# Patient Record
Sex: Female | Born: 1955 | Race: Black or African American | Hispanic: No | Marital: Married | Smoking: Never smoker
Health system: Southern US, Community
[De-identification: ages and names within clinical notes are randomized; demographics above are authoritative.]

## PROBLEM LIST (undated history)

## (undated) DIAGNOSIS — Z789 Other specified health status: Secondary | ICD-10-CM

## (undated) HISTORY — PX: BREAST EXCISIONAL BIOPSY: SUR124

---

## 2002-08-01 ENCOUNTER — Encounter: Admission: RE | Admit: 2002-08-01 | Discharge: 2002-08-01 | Payer: Self-pay | Admitting: Family Medicine

## 2002-08-01 ENCOUNTER — Encounter: Payer: Self-pay | Admitting: Family Medicine

## 2004-07-05 ENCOUNTER — Encounter: Admission: RE | Admit: 2004-07-05 | Discharge: 2004-07-05 | Payer: Self-pay | Admitting: Specialist

## 2004-11-04 ENCOUNTER — Other Ambulatory Visit: Admission: RE | Admit: 2004-11-04 | Discharge: 2004-11-04 | Payer: Self-pay | Admitting: Obstetrics and Gynecology

## 2006-07-22 ENCOUNTER — Ambulatory Visit (HOSPITAL_COMMUNITY): Admission: RE | Admit: 2006-07-22 | Discharge: 2006-07-22 | Payer: Self-pay | Admitting: Obstetrics and Gynecology

## 2006-07-22 ENCOUNTER — Encounter (INDEPENDENT_AMBULATORY_CARE_PROVIDER_SITE_OTHER): Payer: Self-pay | Admitting: *Deleted

## 2009-01-26 ENCOUNTER — Emergency Department (HOSPITAL_COMMUNITY): Admission: EM | Admit: 2009-01-26 | Discharge: 2009-01-26 | Payer: Self-pay | Admitting: Emergency Medicine

## 2009-07-20 ENCOUNTER — Other Ambulatory Visit: Admission: RE | Admit: 2009-07-20 | Discharge: 2009-07-20 | Payer: Self-pay | Admitting: Family Medicine

## 2009-07-30 ENCOUNTER — Encounter: Admission: RE | Admit: 2009-07-30 | Discharge: 2009-07-30 | Payer: Self-pay | Admitting: Family Medicine

## 2009-08-13 ENCOUNTER — Encounter: Admission: RE | Admit: 2009-08-13 | Discharge: 2009-08-13 | Payer: Self-pay | Admitting: Family Medicine

## 2009-08-30 ENCOUNTER — Ambulatory Visit: Payer: Self-pay | Admitting: Gastroenterology

## 2009-09-14 ENCOUNTER — Ambulatory Visit (HOSPITAL_COMMUNITY): Admission: RE | Admit: 2009-09-14 | Discharge: 2009-09-14 | Payer: Self-pay | Admitting: Gastroenterology

## 2009-09-27 ENCOUNTER — Ambulatory Visit: Payer: Self-pay | Admitting: Gastroenterology

## 2010-09-10 ENCOUNTER — Other Ambulatory Visit: Payer: Self-pay | Admitting: Family Medicine

## 2010-09-10 DIAGNOSIS — Z1239 Encounter for other screening for malignant neoplasm of breast: Secondary | ICD-10-CM

## 2010-10-15 ENCOUNTER — Ambulatory Visit
Admission: RE | Admit: 2010-10-15 | Discharge: 2010-10-15 | Disposition: A | Discharge status: Home / Self Care | Payer: Commercial Indemnity | Source: Ambulatory Visit | Attending: Family Medicine | Admitting: Family Medicine

## 2010-10-15 DIAGNOSIS — Z1239 Encounter for other screening for malignant neoplasm of breast: Secondary | ICD-10-CM

## 2010-12-27 NOTE — Op Note (Signed)
Rhonda Branch, Rhonda Branch             ACCOUNT NO.:  [id number]   MEDICAL RECORD NO.:  [id number]          PATIENT TYPE:  AMB   LOCATION:  SDC                           FACILITY:  [facility]   PHYSICIAN:  Juluis Mire, M.D.   DATE OF BIRTH:  03/11/1956   DATE OF PROCEDURE:  07/22/2006  DATE OF DISCHARGE:                               OPERATIVE REPORT   ADMISSION DIAGNOSIS:  Abnormal uterine bleeding with apparent  endometrial polyp.   POSTOPERATIVE DIAGNOSIS:  Abnormal uterine bleeding with apparent  endometrial polyp.   PROCEDURE:  Cervical dilatation, hysteroscopy, resection of polyp.  Multiple endometrial biopsies.  Endometrial curettings.   SURGEON:  Juluis Mire, M.D.   ANESTHESIA:  Was general.   ESTIMATED BLOOD LOSS:  Minimal.   PACKS AND DRAINS:  None.   INTRAOPERATIVE BLOOD REPLACED:  None.   COMPLICATIONS:  None.   INDICATIONS:  Dictated in history and physical.   PROCEDURE AS FOLLOWS:  The patient was taken to OR and placed in supine  position.  After satisfactory level of general endotracheal anesthesia  obtained, the patient was placed in a dorsal position using the [employer]  stirrups.  Perineum and vagina were prepped with Betadine and draped in  a sterile field.  Speculum was placed in the vaginal vault.  Cervix was  grasped with a single-tooth tenaculum.  Paracervical block was  administered using 1% Nesacaine.  Uterus sounded to 8 cm.  Cervix was  serially dilated to a size 33 Pratt dilator.  Operative hysteroscope was  introduced into the intrauterine cavity which was distended using  sorbitol.  Visualization revealed a posterior wall polyp.  This was  resected and sent for pathology.  Multiple endometrial biopsies were  obtained and sent to pathology.  Endometrial curettings were obtained.  Total deficit was minimal.  There was no signs of perforation or active  bleeding.  The single-tooth tenaculum and speculum were then removed.  The patient taken out dorsal  position.  Once alert and extubated,  transferred to the recovery room in good condition.  Sponge, instrument  and needle count was reported as correct by circulating nurse x2.     Juluis Mire, M.D.  Electronically Signed    JSM/MEDQ  D:  07/22/2006  T:  07/22/2006  Job:  [id number]

## 2010-12-27 NOTE — H&P (Signed)
Rhonda Branch, Rhonda Branch NO.:  [id number]   MEDICAL RECORD NO.:  [id number]          PATIENT TYPE:  AMB   LOCATION:  SDC                           FACILITY:  [facility]   PHYSICIAN:  Juluis Mire, M.D.   DATE OF BIRTH:  1955/10/06   DATE OF ADMISSION:  07/22/2006  DATE OF DISCHARGE:                              HISTORY & PHYSICAL   The patient is a 55 year old gravida 3, para 2, abortus 1 female who  presents for hysteroscopy.   Patient has had a history of menstrual irregularities in May of this  year.  We did a saline infusion ultrasound that revealed fibroids.  She  had a 2-cm right ovarian cyst and a large polypoid mass with thin  endometrial cavity.  She failed to arrange hysteroscopy, but she is  admitted at the present time to undergo hysteroscopy for evaluation.   In terms of allergies, no known drug allergies.   MEDICATIONS:  None.   PAST MEDICAL HISTORY:  Usual childhood diseases.  No significant  sequelae.  She has had two prior cesarean sections.  No other surgical  history.   FAMILY HISTORY:  History of hypertension.   SOCIAL HISTORY:  No tobacco or alcohol use.   REVIEW OF SYSTEMS:  Is noncontributory.   PHYSICAL EXAMINATION:  The patient is afebrile with stable vital signs.  HEENT EXAM:  Patient is normocephalic.  Pupils equal and reactive to  light accommodation.  Extraocular were intact.  Sclerae and conjunctivae  were clear.  Oropharynx clear.  NECK:  Without thyromegaly.  BREASTS:  No discrete masses.  LUNGS:  Clear.  CARDIAC SYSTEM:  Regular rhythm and rate without murmurs or gallops.  ABDOMINAL EXAM:  Is benign.  No masses, organomegaly or tenderness.  PELVIC:  Normal external genitalia.  Vaginal mucosa is clear.  Cervix is  unremarkable.  Uterus upper limits of normal size, irregular consistent  with known fibroids.  Adnexa unremarkable.  EXTREMITIES:  Trace edema.  NEUROLOGICAL EXAM:  Is grossly within normal limits.   IMPRESSION:   Abnormal uterine bleeding with endometrial polyp.   PLAN:  The patient will undergo hysteroscopy with resection of polypoid  area.  The risks have been discussed including the risk of infection.  Risk of hemorrhage that could require transfusion with the risk of AIDS  or hepatitis.  The risk of injury to adjacent organs including bladder  or bowel or ureters.  Risk of deep venous thrombosis and pulmonary  embolus.  The patient expressed understanding of indications and risks.      Juluis Mire, M.D.  Electronically Signed     JSM/MEDQ  D:  07/22/2006  T:  07/22/2006  Job:  [id number]

## 2011-07-16 ENCOUNTER — Ambulatory Visit: Payer: Self-pay | Admitting: Physician Assistant

## 2011-09-10 ENCOUNTER — Other Ambulatory Visit: Payer: Self-pay | Admitting: Family Medicine

## 2011-09-10 DIAGNOSIS — M899 Disorder of bone, unspecified: Secondary | ICD-10-CM

## 2011-09-10 DIAGNOSIS — Z1231 Encounter for screening mammogram for malignant neoplasm of breast: Secondary | ICD-10-CM

## 2011-10-20 ENCOUNTER — Other Ambulatory Visit: Payer: Commercial Indemnity

## 2011-10-20 ENCOUNTER — Ambulatory Visit: Payer: Commercial Indemnity

## 2011-11-03 ENCOUNTER — Ambulatory Visit
Admission: RE | Admit: 2011-11-03 | Discharge: 2011-11-03 | Disposition: A | Discharge status: Home / Self Care | Payer: Commercial Indemnity | Source: Ambulatory Visit | Attending: Family Medicine | Admitting: Family Medicine

## 2011-11-03 DIAGNOSIS — M899 Disorder of bone, unspecified: Secondary | ICD-10-CM

## 2011-11-03 DIAGNOSIS — M949 Disorder of cartilage, unspecified: Secondary | ICD-10-CM

## 2011-11-03 DIAGNOSIS — Z1231 Encounter for screening mammogram for malignant neoplasm of breast: Secondary | ICD-10-CM

## 2012-09-15 ENCOUNTER — Other Ambulatory Visit: Payer: Self-pay | Admitting: Family Medicine

## 2012-09-15 DIAGNOSIS — Z1231 Encounter for screening mammogram for malignant neoplasm of breast: Secondary | ICD-10-CM

## 2012-11-03 ENCOUNTER — Ambulatory Visit
Admission: RE | Admit: 2012-11-03 | Discharge: 2012-11-03 | Disposition: A | Discharge status: Home / Self Care | Payer: Commercial Indemnity | Source: Ambulatory Visit | Attending: Family Medicine | Admitting: Family Medicine

## 2012-11-03 DIAGNOSIS — Z1231 Encounter for screening mammogram for malignant neoplasm of breast: Secondary | ICD-10-CM

## 2013-10-24 ENCOUNTER — Other Ambulatory Visit: Payer: Self-pay

## 2013-10-24 ENCOUNTER — Other Ambulatory Visit: Payer: Self-pay | Admitting: Family Medicine

## 2013-10-24 DIAGNOSIS — Z1231 Encounter for screening mammogram for malignant neoplasm of breast: Secondary | ICD-10-CM

## 2013-11-08 ENCOUNTER — Ambulatory Visit
Admission: RE | Admit: 2013-11-08 | Discharge: 2013-11-08 | Disposition: A | Discharge status: Home / Self Care | Payer: BC Managed Care – PPO | Source: Ambulatory Visit

## 2013-11-08 DIAGNOSIS — Z1231 Encounter for screening mammogram for malignant neoplasm of breast: Secondary | ICD-10-CM

## 2013-11-09 ENCOUNTER — Ambulatory Visit: Payer: Commercial Indemnity

## 2016-11-05 ENCOUNTER — Other Ambulatory Visit: Payer: Self-pay | Admitting: Family Medicine

## 2016-11-05 DIAGNOSIS — Z1231 Encounter for screening mammogram for malignant neoplasm of breast: Secondary | ICD-10-CM

## 2016-12-03 ENCOUNTER — Ambulatory Visit
Admission: RE | Admit: 2016-12-03 | Discharge: 2016-12-03 | Disposition: A | Discharge status: Home / Self Care | Payer: Managed Care, Other (non HMO) | Source: Ambulatory Visit | Attending: Family Medicine | Admitting: Family Medicine

## 2016-12-03 DIAGNOSIS — Z1231 Encounter for screening mammogram for malignant neoplasm of breast: Secondary | ICD-10-CM

## 2017-02-10 ENCOUNTER — Other Ambulatory Visit (HOSPITAL_COMMUNITY)
Admission: RE | Admit: 2017-02-10 | Discharge: 2017-02-10 | Disposition: A | Discharge status: Home / Self Care | Payer: Managed Care, Other (non HMO) | Source: Ambulatory Visit | Attending: Family Medicine | Admitting: Family Medicine

## 2017-02-10 ENCOUNTER — Other Ambulatory Visit: Payer: Self-pay | Admitting: Family Medicine

## 2017-02-10 DIAGNOSIS — Z124 Encounter for screening for malignant neoplasm of cervix: Secondary | ICD-10-CM | POA: Insufficient documentation

## 2017-02-16 LAB — CYTOLOGY - PAP
Diagnosis: NEGATIVE
HPV: NOT DETECTED

## 2017-11-05 ENCOUNTER — Other Ambulatory Visit: Payer: Self-pay | Admitting: Family Medicine

## 2017-11-05 DIAGNOSIS — Z1231 Encounter for screening mammogram for malignant neoplasm of breast: Secondary | ICD-10-CM

## 2017-12-04 ENCOUNTER — Ambulatory Visit: Payer: Managed Care, Other (non HMO)

## 2017-12-04 ENCOUNTER — Ambulatory Visit
Admission: RE | Admit: 2017-12-04 | Discharge: 2017-12-04 | Disposition: A | Discharge status: Home / Self Care | Payer: Managed Care, Other (non HMO) | Source: Ambulatory Visit | Attending: Family Medicine | Admitting: Family Medicine

## 2017-12-04 DIAGNOSIS — Z1231 Encounter for screening mammogram for malignant neoplasm of breast: Secondary | ICD-10-CM

## 2018-04-01 ENCOUNTER — Other Ambulatory Visit: Payer: Self-pay

## 2018-04-01 ENCOUNTER — Emergency Department (HOSPITAL_COMMUNITY): Payer: Self-pay

## 2018-04-01 ENCOUNTER — Emergency Department (HOSPITAL_COMMUNITY)
Admission: EM | Admit: 2018-04-01 | Discharge: 2018-04-01 | Disposition: A | Discharge status: Home / Self Care | Payer: Self-pay | Attending: Emergency Medicine | Admitting: Emergency Medicine

## 2018-04-01 ENCOUNTER — Encounter (HOSPITAL_COMMUNITY): Payer: Self-pay

## 2018-04-01 DIAGNOSIS — W19XXXA Unspecified fall, initial encounter: Secondary | ICD-10-CM

## 2018-04-01 DIAGNOSIS — M25551 Pain in right hip: Secondary | ICD-10-CM | POA: Insufficient documentation

## 2018-04-01 DIAGNOSIS — R52 Pain, unspecified: Secondary | ICD-10-CM

## 2018-04-01 DIAGNOSIS — M542 Cervicalgia: Secondary | ICD-10-CM | POA: Insufficient documentation

## 2018-04-01 DIAGNOSIS — R030 Elevated blood-pressure reading, without diagnosis of hypertension: Secondary | ICD-10-CM

## 2018-04-01 HISTORY — DX: Other specified health status: Z78.9

## 2018-04-01 MED ORDER — IBUPROFEN 400 MG PO TABS: 400.0000 mg | ORAL_TABLET | Freq: Four times a day (QID) | ORAL | 0 refills | Status: AC | PRN

## 2018-04-01 MED ORDER — LIDOCAINE 5 % EX PTCH: 1.0000 | MEDICATED_PATCH | CUTANEOUS | 0 refills | Status: AC

## 2018-04-01 NOTE — ED Triage Notes (Signed)
Pt states she fell in [employer] last week. States yesterday she began having right lateral neck pain. Pt alert and oriented, full ROM.

## 2018-04-01 NOTE — ED Provider Notes (Signed)
[facility] EMERGENCY DEPARTMENT Provider Note   CSN: [id number] Arrival date & time: 04/01/18  0906     History   Chief Complaint Chief Complaint  Patient presents with  . Fall    HPI Rhonda Branch is a 62 y.o. female presenting for right-sided neck pain and right hip pain after fall 7 days ago.  Patient states that last week she was walking around [employer] and slipped on a wet floor.  Patient states that she fell striking her right hip and caught herself on her right arm patient denies head injury, loss of consciousness or use of blood thinner.  Patient states that she was ambulatory medially after the incident, was not seen by a medical provider.  Patient describes her right-sided neck pain as a moderate 6/10 in severity throbbing pain that radiates up to her head, worse with turning head to the right.  Patient states that she has not taken anything for this pain.  Patient also endorses 2/10 in severity right hip pain that is also throbbing in nature worse with palpation of the area.  She states she has not taken anything for this pain either.  HPI  Past Medical History:  Diagnosis Date  . Medical history non-contributory     There are no active problems to display for this patient.   Past Surgical History:  Procedure Laterality Date  . BREAST EXCISIONAL BIOPSY Right    1985     OB History   None      Home Medications    Prior to Admission medications   Not on File    Family History Family History  Problem Relation Age of Onset  . Breast cancer Neg Hx     Social History Social History   Tobacco Use  . Smoking status: Never Smoker  . Smokeless tobacco: Never Used  Substance Use Topics  . Alcohol use: Not Currently  . Drug use: Never     Allergies   Patient has no known allergies.   Review of Systems Review of Systems  Constitutional: Negative.  Negative for chills, fatigue and fever.  Eyes: Negative.  Negative for visual  disturbance.  Gastrointestinal: Negative.  Negative for abdominal pain, diarrhea, nausea and vomiting.  Musculoskeletal: Positive for arthralgias and neck pain. Negative for back pain and joint swelling.  Skin: Negative.  Negative for wound.  Neurological: Negative.  Negative for dizziness, syncope, weakness, light-headedness, numbness and headaches.     Physical Exam Updated Vital Signs BP (!) 176/75 (BP Location: Right Arm)   Pulse 70   Temp 98.9 F (37.2 C) (Oral)   Resp 17   SpO2 99%   Physical Exam  Constitutional: She is oriented to person, place, and time. She appears well-developed and well-nourished. No distress.  HENT:  Head: Normocephalic and atraumatic.  Right Ear: External ear normal.  Left Ear: External ear normal.  Nose: Nose normal.  Eyes: Pupils are equal, round, and reactive to light. EOM are normal.  Neck: Trachea normal, normal range of motion and phonation normal. Neck supple. Muscular tenderness present. No tracheal tenderness and no spinous process tenderness present. No neck rigidity. No tracheal deviation and normal range of motion present.    No midline cervical spinal tenderness to palpation, no step-offs, no crepitus no deformity.  Patient with right trapezius muscle tenderness worse to palpation and turning head to right.  No obvious deformity or sign of injury.   Cardiovascular:  Pulses:  Dorsalis pedis pulses are 2+ on the right side, and 2+ on the left side.       Posterior tibial pulses are 2+ on the right side, and 2+ on the left side.  Pulmonary/Chest: Effort normal. No respiratory distress.  Abdominal: Soft. There is no tenderness. There is no rebound and no guarding.  Musculoskeletal: Normal range of motion. She exhibits no deformity.       Right shoulder: Normal. She exhibits normal range of motion, no tenderness, no bony tenderness, no swelling, no crepitus and no deformity.       Left shoulder: Normal.       Right elbow: Normal.       Left elbow: Normal.       Right wrist: Normal.       Left wrist: Normal.       Right hip: She exhibits tenderness. She exhibits normal range of motion, normal strength, no bony tenderness, no swelling, no crepitus and no deformity.       Cervical back: Normal.       Thoracic back: Normal.       Lumbar back: Normal.       Right upper arm: She exhibits tenderness. She exhibits no bony tenderness, no swelling, no edema and no deformity.       Right forearm: Normal.       Right hand: Normal. Normal sensation noted. Normal strength noted.  No midline spinal tenderness to palpation, no paraspinal muscle tenderness, no deformity, crepitus, or step-off noted  Patient with mild tenderness to palpation of the right hip.  No signs of injury, no bruising.  Patient with a small healing 3 cm diameter bruise to the posterior right upper arm.  This appears to be well-healing, no deformity, no increased warmth or erythema or signs of infection.  Patient able to lift arms above head and touch without pain.  Feet:  Right Foot:  Protective Sensation: 3 sites tested. 3 sites sensed.  Left Foot:  Protective Sensation: 3 sites tested. 3 sites sensed.  Neurological: She is alert and oriented to person, place, and time. She has normal strength. No cranial nerve deficit or sensory deficit. She displays a negative Romberg sign.  Mental Status: Alert, oriented, thought content appropriate, able to give a coherent history. Speech fluent without evidence of aphasia. Able to follow 2 step commands without difficulty. Cranial Nerves: II: Peripheral visual fields grossly normal, pupils equal, round, reactive to light III,IV, VI: ptosis not present, extra-ocular motions intact bilaterally V,VII: smile symmetric, eyebrows raise symmetric, facial light touch sensation equal VIII: hearing grossly normal to voice X: uvula elevates symmetrically XI: bilateral shoulder shrug symmetric and strong XII: midline tongue  extension without fassiculations Motor: Normal tone. 5/5 strength in upper and lower extremities bilaterally including strong and equal grip strength and dorsiflexion/plantar flexion Sensory: Sensation intact to light touch in all extremities.Negative Romberg.  Cerebellar: normal finger-to-nose with bilateral upper extremities. Normal heel-to -shin balance bilaterally of the lower extremity. No pronator drift.  Gait: normal gait and balance CV: distal pulses palpable throughout   Skin: Skin is warm and dry.  Psychiatric: She has a normal mood and affect. Her behavior is normal.     ED Treatments / Results  Labs (all labs ordered are listed, but only abnormal results are displayed) Labs Reviewed - No data to display  EKG None  Radiology Dg Cervical Spine Complete  Result Date: 04/01/2018 CLINICAL DATA:  Right lateral neck pain.  Fall. EXAM: CERVICAL SPINE -  COMPLETE 4+ VIEW COMPARISON:  None. FINDINGS: There is no evidence of cervical spine fracture or prevertebral soft tissue swelling. Alignment is normal. No other significant bone abnormalities are identified. IMPRESSION: Negative cervical spine radiographs. Electronically Signed   By: Charlett Nose M.D.   On: 04/01/2018 10:41   Dg Pelvis 1-2 Views  Result Date: 04/01/2018 CLINICAL DATA:  62 year old female status post fall 6 days ago with right hip pain. EXAM: PELVIS - 1-2 VIEW COMPARISON:  None. FINDINGS: Bone mineralization is within normal limits for age. Femoral heads are normally located. Hip joint spaces appear symmetric and normal. Intact pelvis. Sacral ala and SI joints appear normal. Both proximal femurs appear grossly intact. Negative visible bowel gas pattern in lower abdominal/pelvis visceral contours. IMPRESSION: No acute fracture or dislocation identified about the pelvis. If there is lateralizing right hip pain, dedicated right hip series is recommended. Electronically Signed   By: Odessa Fleming M.D.   On: 04/01/2018 10:41    Dg Hip Unilat W Or Wo Pelvis 2-3 Views Right  Result Date: 04/01/2018 CLINICAL DATA:  Right hip pain EXAM: DG HIP (WITH OR WITHOUT PELVIS) 2-3V RIGHT COMPARISON:  None. FINDINGS: Mild degenerative changes in the right hip with early joint space narrowing and mild spurring. No acute bony abnormality. Specifically, no fracture, subluxation, or dislocation. IMPRESSION: Mild degenerative changes.  No acute bony abnormality. Electronically Signed   By: Charlett Nose M.D.   On: 04/01/2018 11:28    Procedures Procedures (including critical care time)  Medications Ordered in ED Medications - No data to display   Initial Impression / Assessment and Plan / ED Course  I have reviewed the triage vital signs and the nursing notes.  Pertinent labs & imaging results that were available during my care of the patient were reviewed by me and considered in my medical decision making (see chart for details).    Patient presenting after fall 7 days ago.  Patient with right-sided neck pain and right hip pain.  No neuro deficits on exam.  Imaging today negative for acute fractures.  It is likely that the patient is experiencing musculoskeletal soreness after her fall.  Patient denies history of kidney disease or gastric bleeding.  Patient prescribed anti-inflammatories for her pain.  Patient also given Lidoderm patch for pain.  Muscle relaxers not prescribed due to patient's age.  Patient encouraged to follow-up with her primary care provider regarding her visit today.  The patient was noted to have elevated BP in ED today. No known diagnosis of HTN.  I instructed the patient to followup with their PCP within 1 week for BP check. I also counseled the patient regarding the signs and symptoms which would require an emergent visit to an emergency department for hypertensive urgency and/or hypertensive emergency. Patient states that she does have a primary care provider that she can reliably follow-up with.  At this  time there does not appear to be any evidence of an acute emergency medical condition and the patient appears stable for discharge with appropriate outpatient follow up. Diagnosis was discussed with patient who verbalizes understanding of care plan and is agreeable to discharge. I have discussed return precautions with patient and family at bedside who verbalize understanding of return precautions. Patient strongly encouraged to follow-up with their PCP. All questions answered.   Note: Portions of this report may have been transcribed using voice recognition software. Every effort was made to ensure accuracy; however, inadvertent computerized transcription errors may still be present.  Final Clinical Impressions(s) / ED Diagnoses   Final diagnoses:  Fall, initial encounter  Neck pain  Right hip pain    ED Discharge Orders    None       Elizabeth PalauMorelli, Cameran Ahmed A, PA-C 04/01/18 1447    Tegeler, Canary Brimhristopher J, MD 04/01/18 1534

## 2018-04-01 NOTE — Discharge Instructions (Signed)
Please return to the Emergency Department for any new or worsening symptoms or if your symptoms do not improve. Please be sure to follow up with your Primary Care Physician as soon as possible regarding your visit today. If you do not have a Primary Doctor please use the resources below to establish one. You may use the anti-inflammatory medication ibuprofen as prescribed for pain.  Please be sure to eat food and drink plenty water while taking this medication. You may use the Lidoderm patch as prescribed for pain over the area of soreness on your right neck/shoulder muscles. Your blood pressure was also elevated in the department today.  Please be sure to follow-up with your primary care provider for a blood pressure recheck and follow-up for this as well. Translation from [employer], there may contain errors. 1. Regrese al Departamento de Emergencias por cualquier sntoma nuevo o que empeore o si sus sntomas no mejoran. 2. Asegrese de hacer un seguimiento con su mdico de atencin primaria lo antes posible con respecto a su visita de hoy. Si no tiene un mdico primario, utilice los recursos a continuacin para Clinical research associate. 3. Puede usar el medicamento antiinflamatorio ibuprofeno segn lo prescrito para Chief Technology Officer. Asegrese de comer y beber mucha agua mientras toma este medicamento. 4. Puede usar el parche Lidoderm segn lo prescrito para el dolor sobre el rea de dolor en los msculos del cuello / hombro derecho.  SOLICITE ATENCIN MDICA SI: El Sport and exercise psychologist empeora y los medicamentos no surten [place]. Tiene dolor muscular que dura ms de 3 das. Tiene una erupcin cutnea o fiebre junto con el dolor muscular. Tiene dolor muscular despus de una picadura de garrapata. Tiene dolor muscular mientras hace actividad fsica, aunque est en buen estado fsico. Tiene enrojecimiento, sensibilidad o hinchazn junto con el dolor muscular. Tiene dolor muscular despus de comenzar un medicamento nuevo o de  cambiar la dosis de un medicamento. SOLICITE ATENCIN MDICA DE INMEDIATO SI: Tiene dificultad para respirar. Presenta dificultad para tragar. Tiene dolor muscular junto con rigidez en el cuello, fiebre y vmitos. Tiene debilidad muscular intensa o no puede mover una parte del cuerpo.  RESOURCE GUIDE  Chronic Pain Problems: Contact [facility]  [phone] Patients need to be referred by their primary care doctor.  Insufficient Money for Medicine: Contact United Way:  call "211" or [facility] [phone].  No Primary Care Doctor: Call [facility]  [phone] - can help you locate a primary care doctor that  accepts your insurance, provides certain services, etc. Physician Referral [phone]  Agencies that provide inexpensive medical care: [facility]  [phone] [facility]  [phone] [facility]  [phone] [facility]  [phone] [facility]  [phone] [facility]  [phone]  Medicaid-accepting [facility] Providers: [facility]- [address]  [phone], Mon-Fri 9am-7pm, Sat 9am-1pm [facility]- [address] [place], Suite [place]  [phone] [facility]- [address] [place]  [phone] [facility]- [address]  [phone] Renaye Rakers- [address] [place], [address], [phone]  Only accepts [place] patients after they have their name  applied to their card  Self Pay (no insurance) in [facility]: Sickle Cell Patients: Dr Willey Blade, [facility]  [address] [place], [phone] [facility]- [address] [place]  [phone]       -  [facility]- [address], [address]       -     [facility]- see information above (Speak to CitigroupPam H if you do not have insurance)       -  [facility]- [address], [phone]       -  [facility]- [address],  [phone]       -  [facility]- [address] [place] [address], [phone]       -  Dr Julio Sickssei-Bonsu-  [address], [address], [place], [phone]       -  [facility]- [address], [phone]       -  [facility]- [address] [place] [address], [phone], also [address], [phone]       -    [facility]- [address], [phone], 1st & 3rd Saturday   every month, 10am-1pm  1) Find a Doctor and Pay Out of Pocket Although you won't have to find out who is covered by your insurance plan, it is a good idea to ask around and get recommendations. You will then need to call the office and see if the doctor you have chosen will accept you as a new patient and what types of options they offer for patients who are self-pay. Some doctors offer discounts or will set up payment plans for their patients who do not have insurance, but you will need to ask so you aren't surprised when you get to your appointment.  2) Contact Your Local Health Department Not all health departments have doctors that can see patients for sick visits, but many do, so it is worth a call to see if yours does. If you don't know where your local health department is, you can check in your phone book. The CDC also has a tool to help you locate your state's health department, and many state websites also have listings of all of their local health departments.  3) Find a Walk-in Clinic If your illness is not likely to be very severe or complicated, you may want to try a walk in clinic. These are popping up all over the country in pharmacies, drugstores, and shopping centers. They're usually staffed by nurse practitioners or physician assistants that have been trained to treat common illnesses and complaints. They're usually fairly quick and inexpensive. However, if you have serious medical issues or chronic medical problems,  these are probably not your best option  STD Testing [facility] of [facility] [place], STD Clinic, [address], [place], phone [phone] or [phone].  Monday - Friday, call for an appointment. [facility] of [facility] [place], STD Clinic, [place], [place], phone [phone] or [phone].  Monday - Friday, call for an appointment.  Abuse/Neglect: [facility] Child Abuse Hotline 559-260-3296([phone]) 502-767-8672 [facility] Child Abuse Hotline (843) 052-4544847-003-6383 (After Hours)  Emergency Shelter:  [facility] [phone](336) [phone]  Maternity Homes: Room at the [facility] [phone] [facility] [phone]  MRSA Hotline #:   226-729-5252639-084-6719  [facility] Resources  [facility]  United Way [facility] [address]                 [address]         [address] [place] [place]  Blondell Reveal Phone:  [phone]                                  Phone:  [phone]                   Phone:  [phone]  [facility], [phone] [facility] [place]- [phone]       -     [facility] in [place], [address],                                  [phone], [facility] Child Abuse Hotline (973)301-5345 or 385-084-8563 (After Hours)   Behavioral Health Services  Substance Abuse Resources: Alcohol and Drug Services  346-325-3160 [facility] 2240863371 [facility] [place] [phone] Floydene Flock [phone] Residential & Outpatient Substance Abuse Program  [phone]  Psychological Services: [facility]  [phone] [facility] Services  [phone] [facility], [phone] [place]. [address], [place], ACCESS LINE: [phone] or  [phone], EntrepreneurLoan.[email or web address]  Dental Assistance  If unable to pay or uninsured, contact:  [facility] or [facility]. to become qualified for the adult dental clinic.  Patients with Medicaid: [facility] [phone] [address] Qu[address], [phone] [address] [phone]  If unable to pay, or uninsured, contact [facility] [phone]) or [facility] Department [phone] in [place], [phone] in [facility]) to become qualified for the adult dental clinic   Other Low-Cost Community Dental Services: [facility]- [address] [place], [place], [place], [address], [phone], Ext. [phone], 2nd and 4th Thursday of the month at 6:30am.  10 clients each day by appointment, can sometimes see walk-in patients if someone does not show for an appointment. [facility]- [address] [place], [place], [address], [phone] [facility] [address] [place], [place], [address], [phone] [facility] Health Department- [phone] [facility] Health Department- [phone] [facility] [phone]

## 2018-09-15 DIAGNOSIS — M79605 Pain in left leg: Secondary | ICD-10-CM | POA: Diagnosis not present

## 2018-09-16 ENCOUNTER — Other Ambulatory Visit: Payer: Self-pay | Admitting: Family Medicine

## 2018-09-16 DIAGNOSIS — M79605 Pain in left leg: Secondary | ICD-10-CM

## 2018-09-17 ENCOUNTER — Ambulatory Visit
Admission: RE | Admit: 2018-09-17 | Discharge: 2018-09-17 | Disposition: A | Discharge status: Home / Self Care | Payer: BLUE CROSS/BLUE SHIELD | Source: Ambulatory Visit | Attending: Family Medicine | Admitting: Family Medicine

## 2018-09-17 DIAGNOSIS — M79605 Pain in left leg: Secondary | ICD-10-CM

## 2018-09-17 DIAGNOSIS — M79662 Pain in left lower leg: Secondary | ICD-10-CM | POA: Diagnosis not present

## 2019-04-27 ENCOUNTER — Other Ambulatory Visit: Payer: Self-pay

## 2019-04-27 DIAGNOSIS — Z20822 Contact with and (suspected) exposure to covid-19: Secondary | ICD-10-CM

## 2019-04-28 LAB — NOVEL CORONAVIRUS, NAA: SARS-CoV-2, NAA: NOT DETECTED

## 2019-04-29 ENCOUNTER — Telehealth: Payer: Self-pay | Admitting: General Practice

## 2019-04-29 NOTE — Telephone Encounter (Signed)
Patient informed of negative covid-19 result. Patient verbalized understanding.  °

## 2019-07-04 ENCOUNTER — Other Ambulatory Visit: Payer: Self-pay | Admitting: Physician Assistant

## 2019-07-04 DIAGNOSIS — M810 Age-related osteoporosis without current pathological fracture: Secondary | ICD-10-CM

## 2019-07-04 DIAGNOSIS — Z0001 Encounter for general adult medical examination with abnormal findings: Secondary | ICD-10-CM | POA: Diagnosis not present

## 2019-07-04 DIAGNOSIS — Z0000 Encounter for general adult medical examination without abnormal findings: Secondary | ICD-10-CM | POA: Diagnosis not present

## 2019-07-04 DIAGNOSIS — R200 Anesthesia of skin: Secondary | ICD-10-CM | POA: Diagnosis not present

## 2019-07-04 DIAGNOSIS — Z1231 Encounter for screening mammogram for malignant neoplasm of breast: Secondary | ICD-10-CM

## 2019-07-11 ENCOUNTER — Other Ambulatory Visit: Payer: Self-pay

## 2019-07-11 DIAGNOSIS — Z20822 Contact with and (suspected) exposure to covid-19: Secondary | ICD-10-CM

## 2019-07-12 LAB — NOVEL CORONAVIRUS, NAA: SARS-CoV-2, NAA: NOT DETECTED

## 2019-07-13 ENCOUNTER — Telehealth: Payer: Self-pay | Admitting: *Deleted

## 2019-07-13 NOTE — Telephone Encounter (Signed)
Patient was given negative result by me because I had her husband on the phone for his result and she asked for hers. Fara Olden, RN-C

## 2019-09-29 ENCOUNTER — Ambulatory Visit: Payer: BC Managed Care – PPO

## 2019-09-29 ENCOUNTER — Other Ambulatory Visit: Payer: BC Managed Care – PPO

## 2019-12-23 ENCOUNTER — Ambulatory Visit
Admission: RE | Admit: 2019-12-23 | Discharge: 2019-12-23 | Disposition: A | Discharge status: Home / Self Care | Payer: Self-pay | Source: Ambulatory Visit | Attending: Physician Assistant | Admitting: Physician Assistant

## 2019-12-23 ENCOUNTER — Other Ambulatory Visit: Payer: Self-pay

## 2019-12-23 ENCOUNTER — Ambulatory Visit
Admission: RE | Admit: 2019-12-23 | Discharge: 2019-12-23 | Disposition: A | Discharge status: Home / Self Care | Payer: BC Managed Care – PPO | Source: Ambulatory Visit | Attending: Physician Assistant | Admitting: Physician Assistant

## 2019-12-23 DIAGNOSIS — M810 Age-related osteoporosis without current pathological fracture: Secondary | ICD-10-CM

## 2019-12-23 DIAGNOSIS — Z1231 Encounter for screening mammogram for malignant neoplasm of breast: Secondary | ICD-10-CM

## 2020-06-08 ENCOUNTER — Ambulatory Visit (HOSPITAL_COMMUNITY)
Admission: EM | Admit: 2020-06-08 | Discharge: 2020-06-08 | Disposition: A | Discharge status: Home / Self Care | Payer: BC Managed Care – PPO | Attending: Family Medicine | Admitting: Family Medicine

## 2020-06-08 ENCOUNTER — Encounter (HOSPITAL_COMMUNITY): Payer: Self-pay

## 2020-06-08 DIAGNOSIS — M549 Dorsalgia, unspecified: Secondary | ICD-10-CM

## 2020-06-08 DIAGNOSIS — R1032 Left lower quadrant pain: Secondary | ICD-10-CM | POA: Diagnosis present

## 2020-06-08 DIAGNOSIS — K5900 Constipation, unspecified: Secondary | ICD-10-CM | POA: Insufficient documentation

## 2020-06-08 DIAGNOSIS — M5432 Sciatica, left side: Secondary | ICD-10-CM | POA: Diagnosis present

## 2020-06-08 LAB — POCT URINALYSIS DIPSTICK, ED / UC
Bilirubin Urine: NEGATIVE
Glucose, UA: NEGATIVE mg/dL
Hgb urine dipstick: NEGATIVE
Ketones, ur: NEGATIVE mg/dL
Nitrite: NEGATIVE
Protein, ur: NEGATIVE mg/dL
Specific Gravity, Urine: 1.025 (ref 1.005–1.030)
Urobilinogen, UA: 0.2 mg/dL (ref 0.0–1.0)
pH: 6.5 (ref 5.0–8.0)

## 2020-06-08 MED ORDER — DOCUSATE SODIUM 100 MG PO CAPS: 100.0000 mg | ORAL_CAPSULE | Freq: Every day | ORAL | 0 refills | Status: AC

## 2020-06-08 MED ORDER — MELOXICAM 7.5 MG PO TABS: 7.5000 mg | ORAL_TABLET | Freq: Every day | ORAL | 0 refills | Status: AC

## 2020-06-08 NOTE — ED Provider Notes (Signed)
[facility]    CSN: [id number] Arrival date & time: 06/08/20  3235      History   Chief Complaint Chief Complaint  Patient presents with  . Abdominal Pain  . Back Pain    HPI Rhonda Branch is a 64 y.o. female.    Rhonda Branch presents with complaints of low back pain, and pain to both legs. This has been ongoing for some time now, but noted over the past 6 months, feeling her toes numb, and  pain worse with standing. Pain radiates from buttocks down legs. Also with pain to left lower quadrant of abdomen. It feels to be associated with her leg pain. Standing and movement of left leg seem to worsen her symptoms to LLQ. Hasn't taken any medications for pain. No specific known injury to her back. She endorses occasional constipation. No urinary symptoms. No blood to urine. No fevers. Doesn't have a PCP.    Amharic video interpreter used to collect history and physical exam.     ROS per HPI, negative if not otherwise mentioned.      Past Medical History:  Diagnosis Date  . Medical history non-contributory     There are no problems to display for this patient.   Past Surgical History:  Procedure Laterality Date  . BREAST EXCISIONAL BIOPSY Right    1985    OB History   No obstetric history on file.      Home Medications    Prior to Admission medications   Medication Sig Start Date End Date Taking? Authorizing Provider  cholecalciferol (VITAMIN D3) 25 MCG (1000 UNIT) tablet Take 1,000 Units by mouth daily.   Yes [provider]  Multiple Vitamin (MULTIVITAMIN) tablet Take 1 tablet by mouth daily.   Yes [provider]  docusate sodium (COLACE) 100 MG capsule Take 1 capsule (100 mg total) by mouth daily. 06/08/20   Georgetta Haber, NP  ibuprofen (ADVIL,MOTRIN) 400 MG tablet Take 1 tablet (400 mg total) by mouth every 6 (six) hours as needed. 04/01/18   Harlene Salts A, PA-C  lidocaine (LIDODERM) 5 % Place 1 patch onto the  skin daily. Remove & Discard patch within 12 hours or as directed by MD 04/01/18   Bill Salinas, PA-C  meloxicam (MOBIC) 7.5 MG tablet Take 1 tablet (7.5 mg total) by mouth daily. 06/08/20   Georgetta Haber, NP    Family History Family History  Problem Relation Age of Onset  . Healthy Mother   . Breast cancer Neg Hx     Social History Social History   Tobacco Use  . Smoking status: Never Smoker  . Smokeless tobacco: Never Used  Vaping Use  . Vaping Use: Never used  Substance Use Topics  . Alcohol use: Not Currently  . Drug use: Never     Allergies   Patient has no known allergies.   Review of Systems Review of Systems   Physical Exam Triage Vital Signs ED Triage Vitals  Enc Vitals Group     BP 06/08/20 1018 137/85     Pulse Rate 06/08/20 1016 71     Resp 06/08/20 1016 19     Temp 06/08/20 1016 97.9 F (36.6 C)     Temp Source 06/08/20 1016 Oral     SpO2 06/08/20 1016 97 %     Weight --      Height --      Head Circumference --      Peak  Flow --      Pain Score 06/08/20 1008 7     Pain Loc --      Pain Edu? --      Excl. in GC? --    No data found.  Updated Vital Signs BP 137/85 (BP Location: Right Arm)   Pulse 71   Temp 97.9 F (36.6 C) (Oral)   Resp 19   SpO2 97%   Visual Acuity Right Eye Distance:   Left Eye Distance:   Bilateral Distance:    Right Eye Near:   Left Eye Near:    Bilateral Near:     Physical Exam Constitutional:      General: She is not in acute distress.    Appearance: She is well-developed.  Cardiovascular:     Rate and Rhythm: Normal rate.  Pulmonary:     Effort: Pulmonary effort is normal.  Abdominal:     Tenderness: There is abdominal tenderness in the left lower quadrant.     Comments: Very mild LLQ abdominal pain   Musculoskeletal:     Lumbar back: Tenderness present. Normal range of motion. Negative right straight leg raise test and negative left straight leg raise test.     Comments: Left low back  pain on palpation with tenderness into left buttock ; strength equal bilaterally; gross sensation intact to lower extremities; ambulatory without difficulty  Skin:    General: Skin is warm and dry.  Neurological:     Mental Status: She is alert and oriented to person, place, and time.      UC Treatments / Results  Labs (all labs ordered are listed, but only abnormal results are displayed) Labs Reviewed  POCT URINALYSIS DIPSTICK, ED / UC - Abnormal; Notable for the following components:      Result Value   Leukocytes,Ua SMALL (*)    All other components within normal limits  URINE CULTURE    EKG   Radiology No results found.  Procedures Procedures (including critical care time)  Medications Ordered in UC Medications - No data to display  Initial Impression / Assessment and Plan / UC Course  I have reviewed the triage vital signs and the nursing notes.  Pertinent labs & imaging results that were available during my care of the patient were reviewed by me and considered in my medical decision making (see chart for details).     Back exam consistent with sciatica, no red flag findings here today. Constipation as source of LLQ pain? Ovarian cysts? Docusate initiated and recommended close follow up with a PCP as may need further evaluation if persistent. Patient verbalized understanding and agreeable to plan.  Patient verbalized understanding and agreeable to plan.    Final Clinical Impressions(s) / UC Diagnoses   Final diagnoses:  Sciatica of left side  Constipation, unspecified constipation type  LLQ abdominal pain     Discharge Instructions     Drink plenty of water to help promote regular bowel movements.  Increase fiber in your diet.  You may start taking docusate daily to help promote regular bowel movements.  Meloxicam daily. Don't take additional ibuprofen for aleve. Take with food.  I hope that this will help with your back and leg pain.  Light and regular  activity as tolerated.  Sleep with pillow under your knees.   Please establish with a primary care provider for recheck and long term management if symptoms persist.   ???? ????? ??????? ??????? ?? ?? ???? ??????? ??? ???? ????? ???? ????? ??????? ??????? ???? ???? ???? ???? ????? ?????? ????? ???? ????  ibuprofen ?????. ???? ?? ????. ?? ????? ?? ????? ??? ???? ?? ??? ??????. ??? ?? ???? ?????? ??? ????? ?????? ??? ??? ????. ???? ???? ????? ???? ???? ?? ???? ?? ?????? ??? ?????? ?? ????? medebenya ye'?nijeti inik'isik'as?ni lemaberetatati bizu wiha yit'et'u? be'?megagebiwo wisit'i fayiberi yich'emiru? medebenya ye'?nijeti inik'isik'as?ni lemasitewawek'i beyek'enu dokusoti mewisedi l?jemiru yichilalu? m?lokis?kami beyek'enu? le'?l?vi tech'emar? ibuprofen ?yiwisedu. kemigibi gari yiwisedu. yihi lejeribawo ina le'igiriwo himemi yiredali biy? tesifa ?derigalehu. k'elali ina medebenya inik'isik'as? inide mechachali? kegulibetiwo betachi tirasi yitenyalu. milikitu kek'et'ele ibakiwoni ledagimi mirimera ina yerejimi g?z? ?sitedaderi kewana tenikebakab? gari yak'wak'umu?    ED Prescriptions    Medication Sig Dispense Auth. Provider   meloxicam (MOBIC) 7.5 MG tablet Take 1 tablet (7.5 mg total) by mouth daily. 20 tablet Linus Mako B, NP   docusate sodium (COLACE) 100 MG capsule Take 1 capsule (100 mg total) by mouth daily. 60 capsule Georgetta Haber, NP     PDMP not reviewed this encounter.   Georgetta Haber, NP 06/08/20 1130

## 2020-06-08 NOTE — ED Triage Notes (Signed)
Pt in with c/o left lower abdominal pain and lower back pain that radiates down her left leg that has been going on for almost 6 months.  Also states that sometimes her leg goes numb when she is walking and the pain is worse when she stands  States that she has not been taking any medication for sxs  Denies dysuria, n/v, diarrhea

## 2020-06-08 NOTE — Discharge Instructions (Signed)
Drink plenty of water to help promote regular bowel movements.  Increase fiber in your diet.  You may start taking docusate daily to help promote regular bowel movements.  Meloxicam daily. Don't take additional ibuprofen for aleve. Take with food.  I hope that this will help with your back and leg pain.  Light and regular activity as tolerated.  Sleep with pillow under your knees.   Please establish with a primary care provider for recheck and long term management if symptoms persist.   ???? ????? ??????? ??????? ?? ?? ???? ??????? ??? ???? ????? ???? ????? ??????? ??????? ???? ???? ???? ???? ????? ?????? ????? ???? ???? ibuprofen ?????. ???? ?? ????. ?? ????? ?? ????? ??? ???? ?? ??? ??????. ??? ?? ???? ?????? ??? ????? ?????? ??? ??? ????. ???? ???? ????? ???? ???? ?? ???? ?? ?????? ??? ?????? ?? ????? medebenya ye'?nijeti inik'isik'as?ni lemaberetatati bizu wiha yit'et'u? be'?megagebiwo wisit'i fayiberi yich'emiru? medebenya ye'?nijeti inik'isik'as?ni lemasitewawek'i beyek'enu dokusoti mewisedi l?jemiru yichilalu? m?lokis?kami beyek'enu? le'?l?vi tech'emar? ibuprofen ?yiwisedu. kemigibi gari yiwisedu. yihi lejeribawo ina le'igiriwo himemi yiredali biy? tesifa ?derigalehu. k'elali ina medebenya inik'isik'as? inide mechachali? kegulibetiwo betachi tirasi yitenyalu. milikitu kek'et'ele ibakiwoni ledagimi mirimera ina yerejimi g?z? ?sitedaderi kewana tenikebakab? gari yak'wak'umu?

## 2020-06-09 LAB — URINE CULTURE: Culture: NO GROWTH

## 2020-12-13 ENCOUNTER — Ambulatory Visit
Admission: RE | Admit: 2020-12-13 | Discharge: 2020-12-13 | Disposition: A | Discharge status: Home / Self Care | Payer: Medicare Other | Source: Ambulatory Visit | Attending: Physician Assistant | Admitting: Physician Assistant

## 2020-12-13 ENCOUNTER — Other Ambulatory Visit: Payer: Self-pay | Admitting: Physician Assistant

## 2020-12-13 ENCOUNTER — Other Ambulatory Visit: Payer: Self-pay

## 2020-12-13 DIAGNOSIS — M5441 Lumbago with sciatica, right side: Secondary | ICD-10-CM

## 2020-12-13 DIAGNOSIS — M25552 Pain in left hip: Secondary | ICD-10-CM

## 2020-12-13 DIAGNOSIS — M5442 Lumbago with sciatica, left side: Secondary | ICD-10-CM

## 2021-05-21 ENCOUNTER — Other Ambulatory Visit: Payer: Self-pay | Admitting: Physician Assistant

## 2021-05-21 DIAGNOSIS — Z1231 Encounter for screening mammogram for malignant neoplasm of breast: Secondary | ICD-10-CM

## 2021-06-18 ENCOUNTER — Ambulatory Visit
Admission: RE | Admit: 2021-06-18 | Discharge: 2021-06-18 | Disposition: A | Discharge status: Home / Self Care | Payer: Medicare Other | Source: Ambulatory Visit | Attending: Physician Assistant | Admitting: Physician Assistant

## 2021-06-18 ENCOUNTER — Other Ambulatory Visit: Payer: Self-pay

## 2021-06-18 DIAGNOSIS — Z1231 Encounter for screening mammogram for malignant neoplasm of breast: Secondary | ICD-10-CM

## 2022-03-10 ENCOUNTER — Other Ambulatory Visit: Payer: Self-pay | Admitting: Physician Assistant

## 2022-03-10 DIAGNOSIS — E2839 Other primary ovarian failure: Secondary | ICD-10-CM

## 2022-06-03 ENCOUNTER — Other Ambulatory Visit: Payer: Self-pay | Admitting: Physician Assistant

## 2022-06-03 DIAGNOSIS — Z1231 Encounter for screening mammogram for malignant neoplasm of breast: Secondary | ICD-10-CM

## 2022-07-25 ENCOUNTER — Ambulatory Visit
Admission: RE | Admit: 2022-07-25 | Discharge: 2022-07-25 | Disposition: A | Discharge status: Home / Self Care | Payer: Medicare Other | Source: Ambulatory Visit | Attending: Physician Assistant | Admitting: Physician Assistant

## 2022-07-25 DIAGNOSIS — Z1231 Encounter for screening mammogram for malignant neoplasm of breast: Secondary | ICD-10-CM

## 2022-08-26 ENCOUNTER — Inpatient Hospital Stay: Admission: RE | Admit: 2022-08-26 | Payer: Medicare Other | Source: Ambulatory Visit

## 2022-09-02 ENCOUNTER — Ambulatory Visit
Admission: RE | Admit: 2022-09-02 | Discharge: 2022-09-02 | Disposition: A | Discharge status: Home / Self Care | Payer: Medicare Other | Source: Ambulatory Visit | Attending: Physician Assistant | Admitting: Physician Assistant

## 2022-09-02 DIAGNOSIS — E2839 Other primary ovarian failure: Secondary | ICD-10-CM

## 2023-03-06 IMAGING — MG MM DIGITAL SCREENING BILAT W/ TOMO AND CAD
8 series · 8 of 24 positions shown · non-contrast
Comparison: Previous exam(s).

CLINICAL DATA: Screening.

EXAM:
DIGITAL SCREENING BILATERAL MAMMOGRAM WITH TOMOSYNTHESIS AND CAD
TECHNIQUE: Bilateral screening digital craniocaudal and mediolateral oblique
mammograms were obtained. Bilateral screening digital breast
tomosynthesis was performed. The images were evaluated with
computer-aided detection.

[L CC synth-2D]
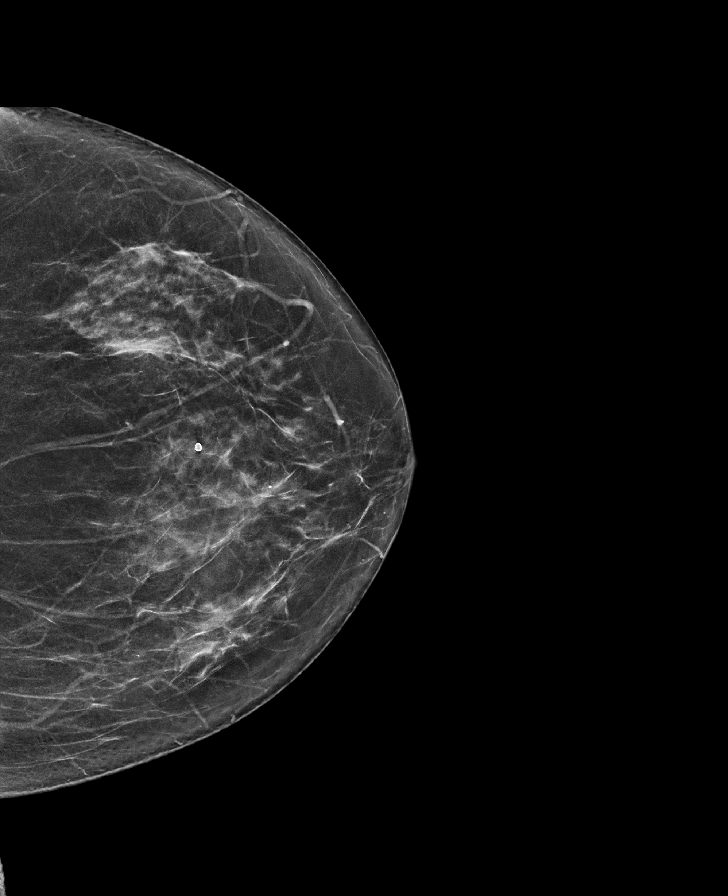

[R MLO synth-2D]
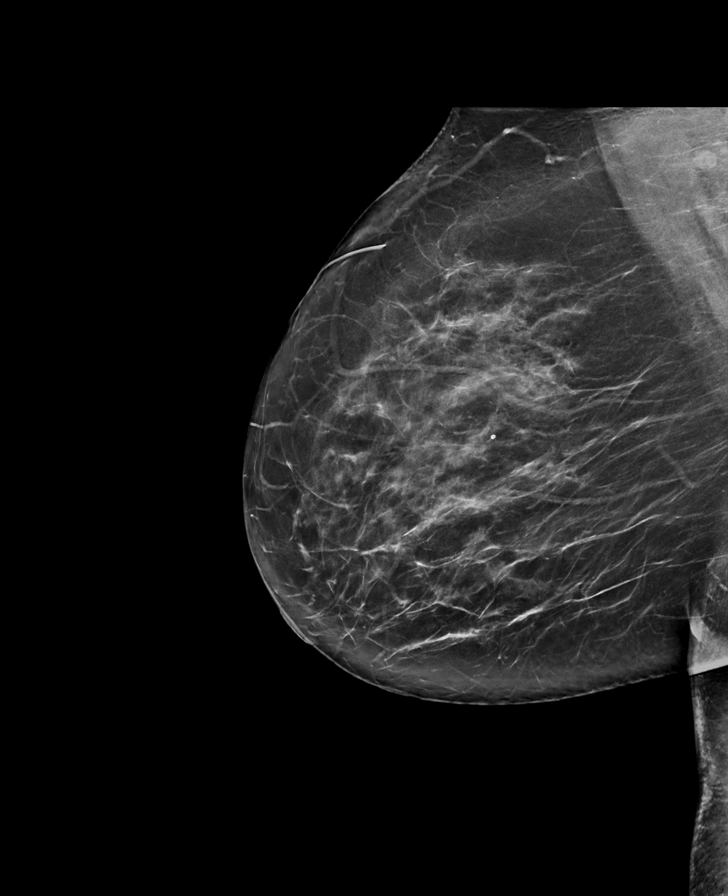

[L MLO synth-2D]
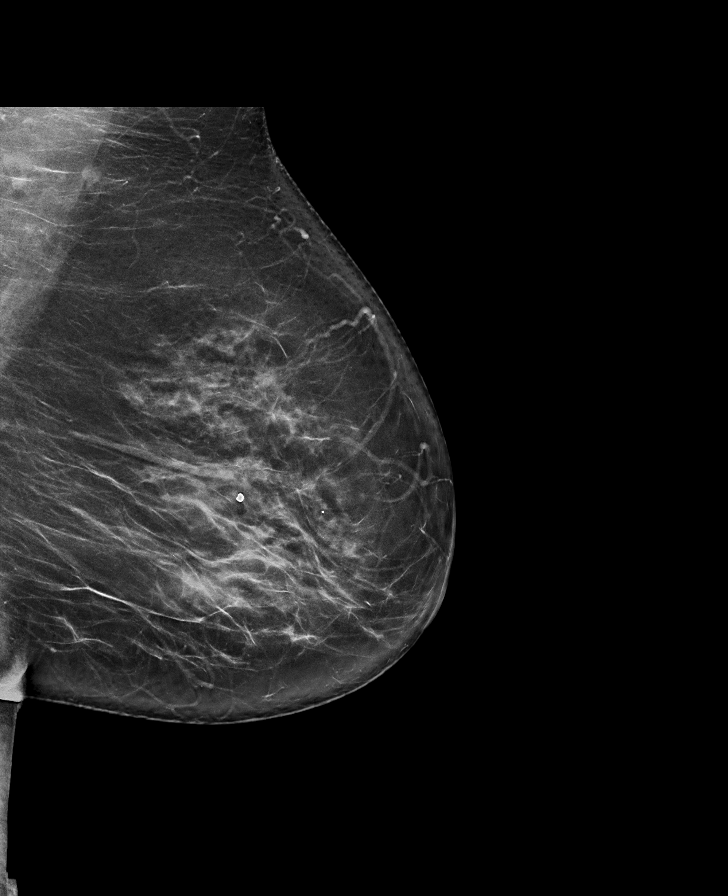

[R CC synth-2D]
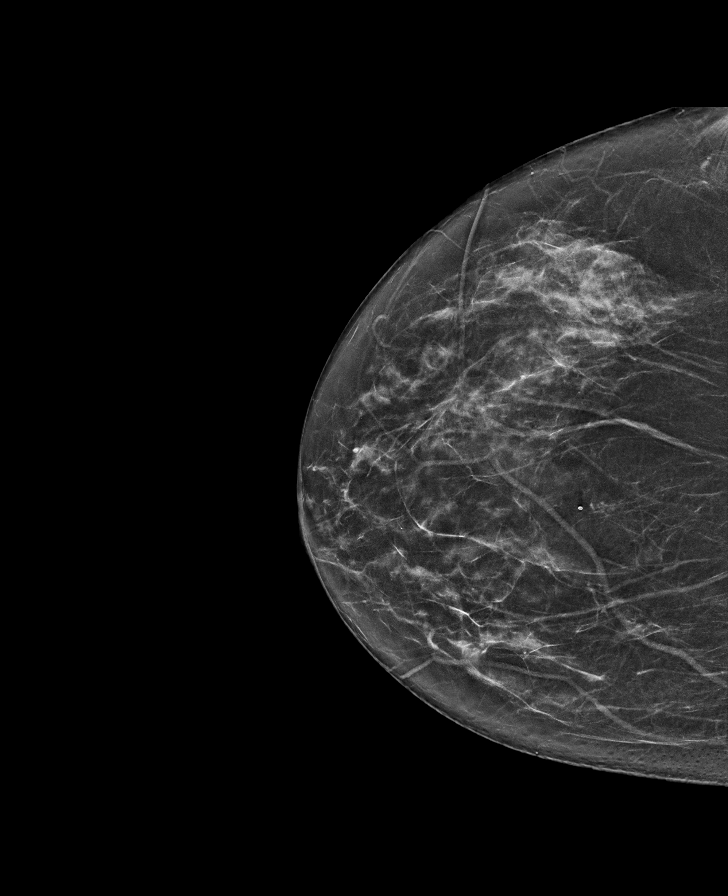

[L MLO tomo · tomo slice 41/81.0]
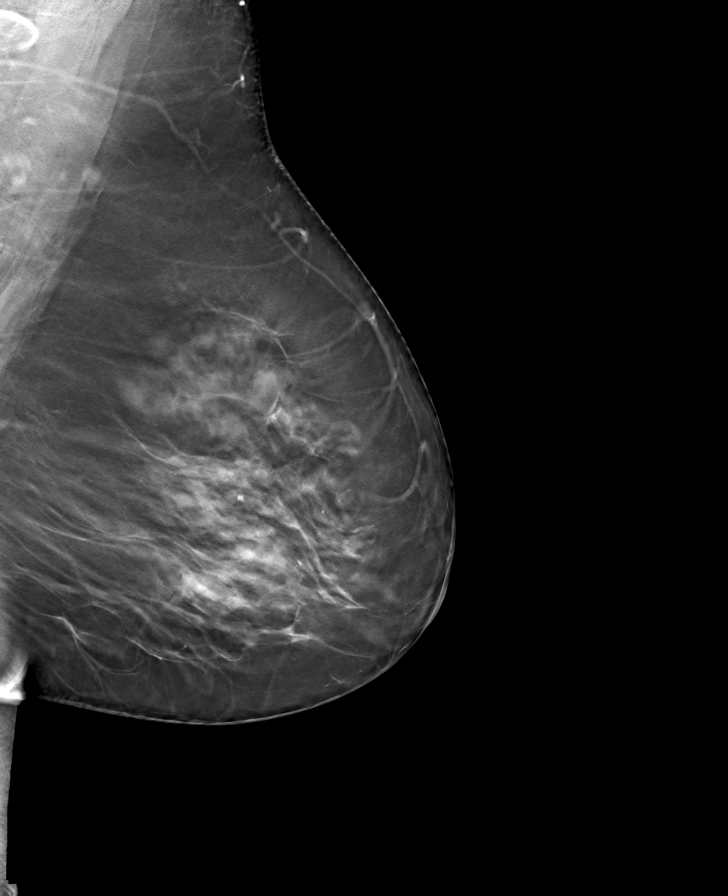

[R CC tomo · tomo slice 34/67.0]
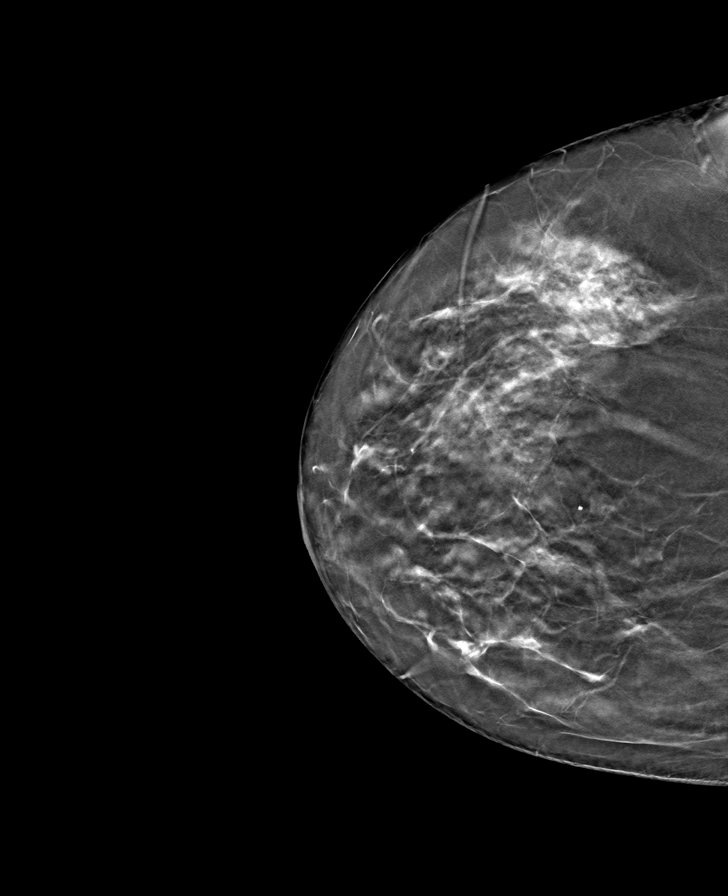

[R MLO tomo · tomo slice 41/80.0]
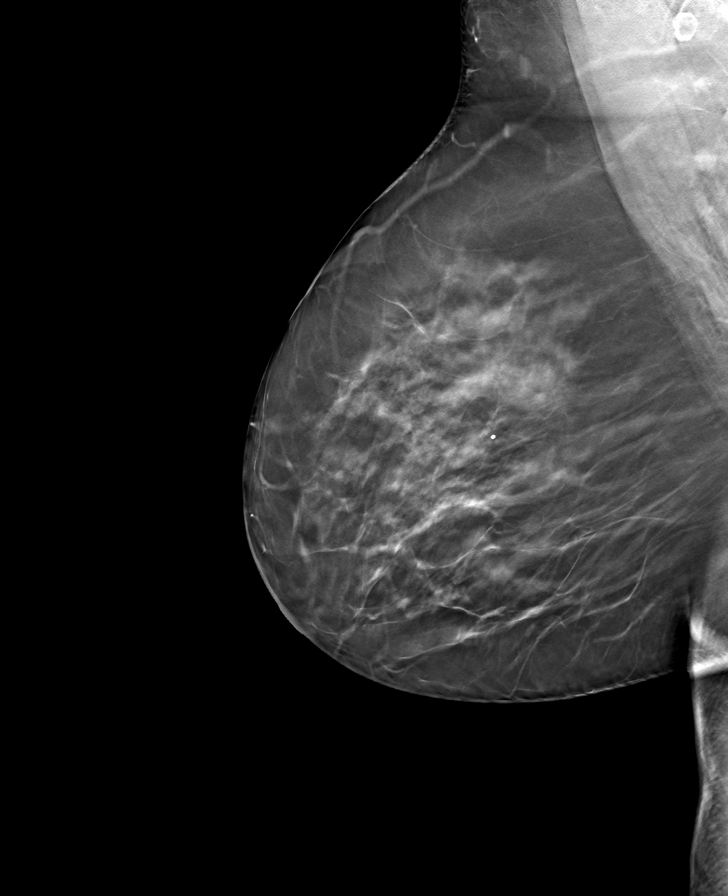

[L CC tomo · tomo slice 35/69.0]
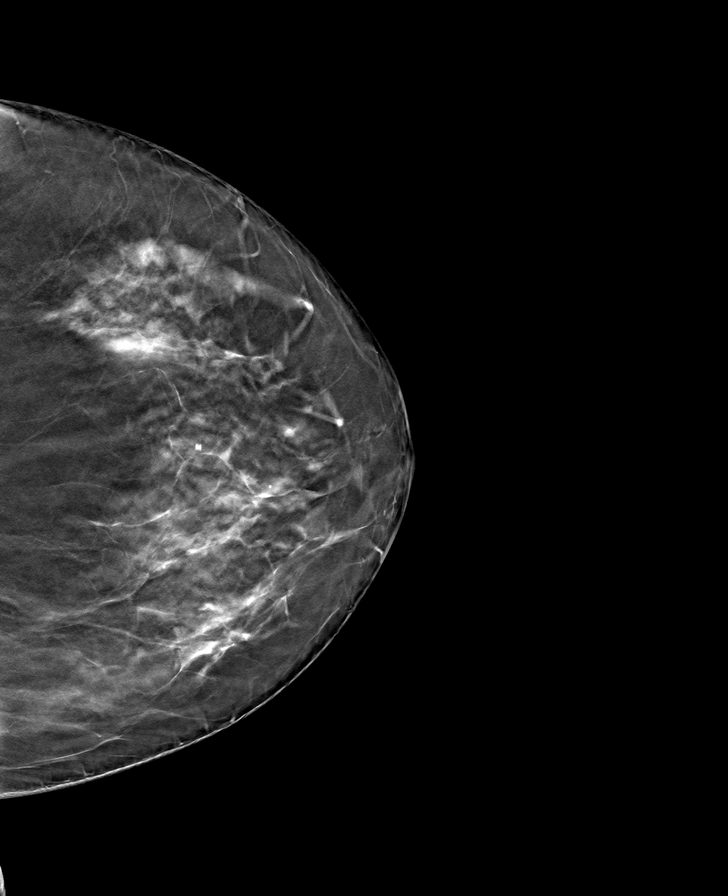

[8 of 24 positions shown; findings below may reference images not displayed]

ACR Breast Density Category c: The breast tissue is heterogeneously
dense, which may obscure small masses.
FINDINGS: There are no findings suspicious for malignancy.
IMPRESSION: No mammographic evidence of malignancy. A result letter of this
screening mammogram will be mailed directly to the patient.

RECOMMENDATION:
Screening mammogram in one year. (Code:Q3-W-BC3)

BI-RADS CATEGORY  1: Negative.

## 2023-03-27 ENCOUNTER — Other Ambulatory Visit: Payer: Self-pay | Admitting: Family Medicine

## 2023-03-27 DIAGNOSIS — Z1239 Encounter for other screening for malignant neoplasm of breast: Secondary | ICD-10-CM

## 2023-05-12 ENCOUNTER — Other Ambulatory Visit: Payer: Self-pay | Admitting: Family Medicine

## 2023-05-12 ENCOUNTER — Ambulatory Visit
Admission: RE | Admit: 2023-05-12 | Discharge: 2023-05-12 | Disposition: A | Discharge status: Home / Self Care | Payer: Medicare Other | Source: Ambulatory Visit | Attending: Family Medicine | Admitting: Family Medicine

## 2023-05-12 DIAGNOSIS — M25552 Pain in left hip: Secondary | ICD-10-CM

## 2023-11-14 ENCOUNTER — Emergency Department (HOSPITAL_COMMUNITY)
Admission: EM | Admit: 2023-11-14 | Discharge: 2023-11-14 | Disposition: A | Discharge status: Home / Self Care | Attending: Emergency Medicine | Admitting: Emergency Medicine

## 2023-11-14 ENCOUNTER — Other Ambulatory Visit: Payer: Self-pay

## 2023-11-14 DIAGNOSIS — M5442 Lumbago with sciatica, left side: Secondary | ICD-10-CM | POA: Diagnosis not present

## 2023-11-14 DIAGNOSIS — M545 Low back pain, unspecified: Secondary | ICD-10-CM | POA: Diagnosis present

## 2023-11-14 MED ORDER — OXYCODONE-ACETAMINOPHEN 5-325 MG PO TABS
1.0000 | ORAL_TABLET | Freq: Once | ORAL | Status: AC
  Administered 2023-11-14: 1 via ORAL
  Filled 2023-11-14: qty 1

## 2023-11-14 MED ORDER — METHYLPREDNISOLONE 4 MG PO TBPK: ORAL_TABLET | Freq: Every day | ORAL | 0 refills | Status: DC

## 2023-11-14 MED ORDER — METHYLPREDNISOLONE 4 MG PO TBPK: ORAL_TABLET | Freq: Every day | ORAL | 0 refills | Status: AC

## 2023-11-14 MED ORDER — LIDOCAINE 5 % EX PTCH
1.0000 | MEDICATED_PATCH | CUTANEOUS | Status: DC
  Administered 2023-11-14: 1 via TRANSDERMAL
  Filled 2023-11-14: qty 1

## 2023-11-14 MED ORDER — DEXAMETHASONE 4 MG PO TABS
10.0000 mg | ORAL_TABLET | Freq: Once | ORAL | Status: AC
  Administered 2023-11-14: 10 mg via ORAL
  Filled 2023-11-14: qty 1

## 2023-11-14 NOTE — ED Triage Notes (Signed)
 Pt arrived via POV. C/o sacral back pain for 2x days. Worse when sitting. No associated injury.  AOx4

## 2023-11-14 NOTE — ED Provider Notes (Signed)
  EMERGENCY DEPARTMENT AT [facility] Provider Note   CSN: [id number] Arrival date & time: 11/14/23  1447     History  Chief Complaint  Patient presents with   Back Pain    KATYE VALEK is a 68 y.o. female, history of lumbar spondylosis, who presents to the ED secondary to lower back pain, radiating down the left leg, this been numb and tingling has been going on for the last 2 days.  Worse with different activities.  Denies any loss of bowel, bladder.  Not having any fevers or chills.  States that she has had this happen before, and was previously in therapy, but currently is not any therapy.  Denies any kind of like trauma, or falls recently.  Patient denies any kind of rectal pain, or history of any kind of fistulas or abscesses.  Home Medications Prior to Admission medications   Medication Sig Start Date End Date Taking? Authorizing Provider  methylPREDNISolone (MEDROL DOSEPAK) 4 MG TBPK tablet Take by mouth daily for 6 days. Taper over 6 days 11/14/23 11/20/23 Yes Dell Hurtubise L, PA  cholecalciferol (VITAMIN D3) 25 MCG (1000 UNIT) tablet Take 1,000 Units by mouth daily.    [provider]  docusate sodium (COLACE) 100 MG capsule Take 1 capsule (100 mg total) by mouth daily. 06/08/20   Georgetta Haber, NP  ibuprofen (ADVIL,MOTRIN) 400 MG tablet Take 1 tablet (400 mg total) by mouth every 6 (six) hours as needed. 04/01/18   Harlene Salts A, PA-C  lidocaine (LIDODERM) 5 % Place 1 patch onto the skin daily. Remove & Discard patch within 12 hours or as directed by MD 04/01/18   Bill Salinas, PA-C  meloxicam (MOBIC) 7.5 MG tablet Take 1 tablet (7.5 mg total) by mouth daily. 06/08/20   Georgetta Haber, NP  Multiple Vitamin (MULTIVITAMIN) tablet Take 1 tablet by mouth daily.    [provider]      Allergies    Patient has no known allergies.    Review of Systems   Review of Systems  Genitourinary:  Negative for decreased urine volume.   Musculoskeletal:  Positive for back pain.    Physical Exam Updated Vital Signs BP 139/71 (BP Location: Left Arm)   Pulse 69   Temp 97.9 F (36.6 C) (Oral)   Resp 16   Ht 4\' 7"  (1.397 m)   Wt 71.7 kg   SpO2 97%   BMI 36.72 kg/m  Physical Exam Vitals and nursing note reviewed.  Constitutional:      General: She is not in acute distress.    Appearance: She is well-developed.  HENT:     Head: Normocephalic and atraumatic.  Eyes:     Conjunctiva/sclera: Conjunctivae normal.  Cardiovascular:     Rate and Rhythm: Normal rate and regular rhythm.     Heart sounds: No murmur heard. Pulmonary:     Effort: Pulmonary effort is normal. No respiratory distress.     Breath sounds: Normal breath sounds.  Abdominal:     Palpations: Abdomen is soft.     Tenderness: There is no abdominal tenderness.  Musculoskeletal:        General: No swelling.     Cervical back: Neck supple.     Comments: Tenderness to palpation left paraspinal muscles, of the lumbar spine.  Positive straight leg raise.  Positive dorsalis pedis. No evidence of edema, rash, or warmth.   Skin:    General: Skin is warm and dry.  Capillary Refill: Capillary refill takes less than 2 seconds.  Neurological:     Mental Status: She is alert.  Psychiatric:        Mood and Affect: Mood normal.     ED Results / Procedures / Treatments   Labs (all labs ordered are listed, but only abnormal results are displayed) Labs Reviewed - No data to display  EKG None  Radiology No results found.  Procedures Procedures    Medications Ordered in ED Medications  lidocaine (LIDODERM) 5 % 1 patch (1 patch Transdermal Patch Applied 11/14/23 1535)  dexamethasone (DECADRON) tablet 10 mg (10 mg Oral Given 11/14/23 1534)  oxyCODONE-acetaminophen (PERCOCET/ROXICET) 5-325 MG per tablet 1 tablet (1 tablet Oral Given 11/14/23 1534)    ED Course/ Medical Decision Making/ A&P Clinical Course as of 11/14/23 1611  Sat Nov 14, 2023  1555  Back pain. No red flags [CC]    Clinical Course User Index [CC] Glyn Ade, MD                                 Medical Decision Making Patient is a 68 year old female, history of spondylosis, here for low back pain, rating down the left leg, has been going on for the last 2 days.  States numbing tingling, worse with different activities.  No red flag symptoms.  She is overall well-appearing, has no evidence of any kind of wounds, ecchymosis, or abscesses.  She declined sacral exam.  She denies any sacral pain for me.  She has no neurodeficits, discharged home with Medrol Dosepak, feeling better after dexamethasone,  Percocet and lidocaine patch.  Able to ambulate freely.  No injuries.  Return precautions emphasized  Risk Prescription drug management.   Final Clinical Impression(s) / ED Diagnoses Final diagnoses:  Acute back pain with sciatica, left    Rx / DC Orders ED Discharge Orders          Ordered    methylPREDNISolone (MEDROL DOSEPAK) 4 MG TBPK tablet  Daily,   Status:  Discontinued        11/14/23 1608    methylPREDNISolone (MEDROL DOSEPAK) 4 MG TBPK tablet  Daily        11/14/23 1608              Starr Urias Elbert Ewings, [place] 11/14/23 1612    Glyn Ade, MD 11/15/23 1306

## 2023-11-14 NOTE — Discharge Instructions (Addendum)
 urinalysis shows hazy urine I believe that your pain is likely secondary to sciatica, I provided you rehab information above.  I am going to send you home, and a Medrol Dosepak, to help with the pain.  Return to the ER if you have any loss of bowel, bladder, or fevers or chills.  Please follow-up with your primary care doctor, to talk about going to sciatic rehab again.
